# Patient Record
Sex: Female | Born: 1970 | Marital: Married | Smoking: Never smoker
Health system: Southern US, Community
[De-identification: ages and names within clinical notes are randomized; demographics above are authoritative.]

---

## 2020-03-13 ENCOUNTER — Encounter (HOSPITAL_COMMUNITY): Payer: Self-pay

## 2020-03-13 ENCOUNTER — Ambulatory Visit (HOSPITAL_COMMUNITY)
Admission: EM | Admit: 2020-03-13 | Discharge: 2020-03-13 | Disposition: A | Discharge status: Home / Self Care | Payer: Self-pay | Attending: Physician Assistant | Admitting: Physician Assistant

## 2020-03-13 ENCOUNTER — Other Ambulatory Visit: Payer: Self-pay

## 2020-03-13 DIAGNOSIS — M26621 Arthralgia of right temporomandibular joint: Secondary | ICD-10-CM

## 2020-03-13 MED ORDER — IBUPROFEN 800 MG PO TABS: 800.0000 mg | ORAL_TABLET | Freq: Three times a day (TID) | ORAL | 0 refills | Status: DC

## 2020-03-13 NOTE — ED Provider Notes (Signed)
[facility]    CSN: [id number] Arrival date & time: 03/13/20  1139      History   Chief Complaint Chief Complaint  Patient presents with   Jaw Pain    HPI Olivia Lee is a 49 y.o. female.   Patient here c/w R sided jaw pain, located at TMJ.  Worse with chewing, opening mouth wide.  Admits HA.  Denies f/c, swelling, erythema.  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     No past medical history on file.  There are no problems to display for this patient.     OB History   No obstetric history on file.      Home Medications    Prior to Admission medications   Medication Sig Start Date End Date Taking? Authorizing Provider  ibuprofen (ADVIL) 800 MG tablet Take 1 tablet (800 mg total) by mouth 3 (three) times daily. 03/13/20   Evern Core, PA-C    Family History Family History  Family history unknown: Yes    Social History Social History   Tobacco Use   Smoking status: Never Smoker     Allergies   Patient has no known allergies.   Review of Systems Review of Systems  Constitutional: Negative for chills, fatigue and fever.  HENT: Positive for dental problem. Negative for congestion, ear pain, facial swelling, nosebleeds, postnasal drip, rhinorrhea, sinus pressure, sinus pain and sore throat.   Eyes: Negative for pain and redness.  Respiratory: Negative for cough, shortness of breath and wheezing.   Gastrointestinal: Negative for abdominal pain, diarrhea, nausea and vomiting.  Musculoskeletal: Positive for myalgias. Negative for arthralgias.  Skin: Negative for rash.  Neurological: Positive for headaches. Negative for light-headedness.  Hematological: Negative for adenopathy. Does not bruise/bleed easily.  Psychiatric/Behavioral: Negative for confusion and sleep disturbance.     Physical Exam Triage Vital Signs ED Triage Vitals  Enc Vitals Group     BP 03/13/20 1322 109/74     Pulse  Rate 03/13/20 1322 85     Resp 03/13/20 1322 18     Temp 03/13/20 1322 98.3 F (36.8 C)     Temp Source 03/13/20 1322 Oral     SpO2 03/13/20 1322 100 %     Weight --      Height --      Head Circumference --      Peak Flow --      Pain Score 03/13/20 1324 7     Pain Loc --      Pain Edu? --      Excl. in GC? --    No data found.  Updated Vital Signs BP 109/74 (BP Location: Right Arm)    Pulse 85    Temp 98.3 F (36.8 C) (Oral)    Resp 18    SpO2 100%   Visual Acuity Right Eye Distance:   Left Eye Distance:   Bilateral Distance:    Right Eye Near:   Left Eye Near:    Bilateral Near:     Physical Exam Vitals and nursing note reviewed.  Constitutional:      General: She is not in acute distress.    Appearance: Normal appearance. She is not ill-appearing.  HENT:     Head: Normocephalic and atraumatic.     Right Ear: Tympanic membrane and ear canal normal.     Left Ear: Tympanic membrane and ear canal normal.     Nose: No  congestion or rhinorrhea.     Mouth/Throat:     Dentition: Abnormal dentition (multiple missing teeth). No dental tenderness, gingival swelling, dental caries or dental abscesses.     Pharynx: No oropharyngeal exudate or posterior oropharyngeal erythema.     Comments: B/L clicking/popping of TMJ Tenderness along R side masseter Eyes:     General: No scleral icterus.    Extraocular Movements: Extraocular movements intact.     Conjunctiva/sclera: Conjunctivae normal.  Cardiovascular:     Rate and Rhythm: Normal rate and regular rhythm.     Heart sounds: No murmur heard.   Pulmonary:     Effort: Pulmonary effort is normal. No respiratory distress.     Breath sounds: Normal breath sounds. No wheezing or rales.  Musculoskeletal:     Cervical back: Normal range of motion. No rigidity.  Skin:    Capillary Refill: Capillary refill takes less than 2 seconds.     Coloration: Skin is not jaundiced.     Findings: No rash.  Neurological:     General: No  focal deficit present.     Mental Status: She is alert and oriented to person, place, and time.     Motor: No weakness.     Gait: Gait normal.  Psychiatric:        Mood and Affect: Mood normal.        Behavior: Behavior normal.      UC Treatments / Results  Labs (all labs ordered are listed, but only abnormal results are displayed) Labs Reviewed - No data to display  EKG   Radiology No results found.  Procedures Procedures (including critical care time)  Medications Ordered in UC Medications - No data to display  Initial Impression / Assessment and Plan / UC Course  I have reviewed the triage vital signs and the nursing notes.  Pertinent labs & imaging results that were available during my care of the patient were reviewed by me and considered in my medical decision making (see chart for details).     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    ED Prescriptions    Medication Sig Dispense Auth. Provider   ibuprofen (ADVIL) 800 MG tablet Take 1 tablet (800 mg total) by mouth 3 (three) times daily. 21 tablet Evern Core, PA-C     PDMP not reviewed this encounter.   Evern Core, PA-C 03/13/20 1353

## 2020-03-13 NOTE — Discharge Instructions (Signed)
Follow up with dentist if no improvement.

## 2020-03-13 NOTE — ED Triage Notes (Signed)
Pt presents with right side jaw pain since last night.

## 2020-05-05 DIAGNOSIS — Z131 Encounter for screening for diabetes mellitus: Secondary | ICD-10-CM | POA: Diagnosis not present

## 2020-05-05 DIAGNOSIS — Z319 Encounter for procreative management, unspecified: Secondary | ICD-10-CM | POA: Diagnosis not present

## 2020-05-05 DIAGNOSIS — N926 Irregular menstruation, unspecified: Secondary | ICD-10-CM | POA: Diagnosis not present

## 2020-05-05 DIAGNOSIS — Z6820 Body mass index (BMI) 20.0-20.9, adult: Secondary | ICD-10-CM | POA: Diagnosis not present

## 2020-05-19 DIAGNOSIS — Z1231 Encounter for screening mammogram for malignant neoplasm of breast: Secondary | ICD-10-CM | POA: Diagnosis not present

## 2020-05-19 DIAGNOSIS — Z780 Asymptomatic menopausal state: Secondary | ICD-10-CM | POA: Diagnosis not present

## 2020-05-19 DIAGNOSIS — Z01411 Encounter for gynecological examination (general) (routine) with abnormal findings: Secondary | ICD-10-CM | POA: Diagnosis not present

## 2020-05-19 DIAGNOSIS — Z681 Body mass index (BMI) 19 or less, adult: Secondary | ICD-10-CM | POA: Diagnosis not present

## 2020-05-19 DIAGNOSIS — Z131 Encounter for screening for diabetes mellitus: Secondary | ICD-10-CM | POA: Diagnosis not present

## 2020-05-19 DIAGNOSIS — N926 Irregular menstruation, unspecified: Secondary | ICD-10-CM | POA: Diagnosis not present

## 2020-05-19 DIAGNOSIS — Z124 Encounter for screening for malignant neoplasm of cervix: Secondary | ICD-10-CM | POA: Diagnosis not present

## 2020-05-19 DIAGNOSIS — Z319 Encounter for procreative management, unspecified: Secondary | ICD-10-CM | POA: Diagnosis not present

## 2020-05-19 DIAGNOSIS — Z01419 Encounter for gynecological examination (general) (routine) without abnormal findings: Secondary | ICD-10-CM | POA: Diagnosis not present

## 2021-02-03 DIAGNOSIS — Z13220 Encounter for screening for lipoid disorders: Secondary | ICD-10-CM | POA: Diagnosis not present

## 2021-02-03 DIAGNOSIS — Z0000 Encounter for general adult medical examination without abnormal findings: Secondary | ICD-10-CM | POA: Diagnosis not present

## 2021-04-24 DIAGNOSIS — L299 Pruritus, unspecified: Secondary | ICD-10-CM | POA: Diagnosis not present

## 2021-04-24 DIAGNOSIS — M545 Low back pain, unspecified: Secondary | ICD-10-CM | POA: Diagnosis not present

## 2021-08-21 ENCOUNTER — Encounter (HOSPITAL_COMMUNITY): Payer: Self-pay

## 2021-08-21 ENCOUNTER — Emergency Department (HOSPITAL_COMMUNITY)
Admission: EM | Admit: 2021-08-21 | Discharge: 2021-08-22 | Discharge status: Against Medical Advice | Payer: BC Managed Care – PPO | Attending: Emergency Medicine | Admitting: Emergency Medicine

## 2021-08-21 DIAGNOSIS — N939 Abnormal uterine and vaginal bleeding, unspecified: Secondary | ICD-10-CM | POA: Insufficient documentation

## 2021-08-21 DIAGNOSIS — R1030 Lower abdominal pain, unspecified: Secondary | ICD-10-CM | POA: Insufficient documentation

## 2021-08-21 DIAGNOSIS — Z5321 Procedure and treatment not carried out due to patient leaving prior to being seen by health care provider: Secondary | ICD-10-CM | POA: Diagnosis not present

## 2021-08-21 DIAGNOSIS — D259 Leiomyoma of uterus, unspecified: Secondary | ICD-10-CM | POA: Diagnosis not present

## 2021-08-21 DIAGNOSIS — D252 Subserosal leiomyoma of uterus: Secondary | ICD-10-CM | POA: Diagnosis not present

## 2021-08-21 LAB — CBC
HCT: 39.8 % (ref 36.0–46.0)
Hemoglobin: 13.3 g/dL (ref 12.0–15.0)
MCH: 29.7 pg (ref 26.0–34.0)
MCHC: 33.4 g/dL (ref 30.0–36.0)
MCV: 88.8 fL (ref 80.0–100.0)
Platelets: 323 10*3/uL (ref 150–400)
RBC: 4.48 MIL/uL (ref 3.87–5.11)
RDW: 13.1 % (ref 11.5–15.5)
WBC: 6.1 10*3/uL (ref 4.0–10.5)
nRBC: 0 % (ref 0.0–0.2)

## 2021-08-21 LAB — COMPREHENSIVE METABOLIC PANEL
ALT: 25 U/L (ref 0–44)
AST: 21 U/L (ref 15–41)
Albumin: 3.7 g/dL (ref 3.5–5.0)
Alkaline Phosphatase: 64 U/L (ref 38–126)
Anion gap: 8 (ref 5–15)
BUN: 15 mg/dL (ref 6–20)
CO2: 21 mmol/L — ABNORMAL LOW (ref 22–32)
Calcium: 8.4 mg/dL — ABNORMAL LOW (ref 8.9–10.3)
Chloride: 103 mmol/L (ref 98–111)
Creatinine, Ser: 0.79 mg/dL (ref 0.44–1.00)
GFR, Estimated: >60 mL/min (ref >60)
Glucose, Bld: 104 mg/dL — ABNORMAL HIGH (ref 70–99)
Potassium: 3.6 mmol/L (ref 3.5–5.1)
Sodium: 132 mmol/L — ABNORMAL LOW (ref 135–145)
Total Bilirubin: 0.5 mg/dL (ref 0.3–1.2)
Total Protein: 6.9 g/dL (ref 6.5–8.1)

## 2021-08-21 LAB — I-STAT BETA HCG BLOOD, ED (MC, WL, AP ONLY): I-stat hCG, quantitative: <5 m[IU]/mL (ref <5)

## 2021-08-21 LAB — LIPASE, BLOOD: Lipase: 26 U/L (ref 11–51)

## 2021-08-21 NOTE — ED Provider Triage Note (Signed)
Emergency Medicine Provider Triage Evaluation Note  Elaya Mahjoub Rosezella [place] , a 51 y.o. female  was evaluated in triage.  Pt complains of heavy vaginal bleeding with severe lower abdominal cramping that started today.  Patient with no period for the last 2 months but prior to that was having bleeding every 3 weeks intermittently missing periods.  Patient is accompanied by her husband who serves as Equities trader.  They state that they are trying to conceive and show this provider over-the-counter fertility/conception supplements.  Review of Systems  Positive: Vaginal bleeding lower abdominal grade Negative: Thing fevers, chills, nausea, vomiting, diarrhea  Physical Exam  BP 121/83   Pulse 65   Temp 98.3 F (36.8 C) (Oral)   Resp 18   SpO2 100%  Gen:   Awake, no distress   Resp:  Normal effort  MSK:   Moves extremities without difficulty  Other:  Suprapubic tenderness palpation without rebound or guarding.  No CVAT.  Lungs CTA B.  Medical Decision Making  Medically screening exam initiated at 11:06 PM.  Appropriate orders placed.  Delia Mahjoub A Wienke was informed that the remainder of the evaluation will be completed by another provider, this initial triage assessment does not replace that evaluation, and the importance of remaining in the ED until their evaluation is complete.  This chart was dictated using voice recognition software, Dragon. Despite the best efforts of this provider to proofread and correct errors, errors may still occur which can change documentation meaning.    Paris Lore, PA-C 08/21/21 2315

## 2021-08-21 NOTE — ED Triage Notes (Signed)
Pt is here today bc of abd pain & vaginal bleeding . Pt reports that she is trying to get pregnant and is taking fertility pills to help and since she has been having vaginal bleeding

## 2021-08-22 ENCOUNTER — Emergency Department (HOSPITAL_COMMUNITY): Payer: BC Managed Care – PPO

## 2021-08-22 DIAGNOSIS — N939 Abnormal uterine and vaginal bleeding, unspecified: Secondary | ICD-10-CM | POA: Diagnosis not present

## 2021-08-22 DIAGNOSIS — D252 Subserosal leiomyoma of uterus: Secondary | ICD-10-CM | POA: Diagnosis not present

## 2021-08-22 DIAGNOSIS — D259 Leiomyoma of uterus, unspecified: Secondary | ICD-10-CM | POA: Diagnosis not present

## 2021-08-22 NOTE — ED Notes (Signed)
Spouse informed [facility] they were leaving

## 2022-02-06 DIAGNOSIS — Z1152 Encounter for screening for COVID-19: Secondary | ICD-10-CM | POA: Diagnosis not present

## 2022-02-06 DIAGNOSIS — J069 Acute upper respiratory infection, unspecified: Secondary | ICD-10-CM | POA: Diagnosis not present

## 2022-02-06 DIAGNOSIS — J029 Acute pharyngitis, unspecified: Secondary | ICD-10-CM | POA: Diagnosis not present

## 2022-02-06 DIAGNOSIS — R0982 Postnasal drip: Secondary | ICD-10-CM | POA: Diagnosis not present

## 2022-03-15 DIAGNOSIS — E559 Vitamin D deficiency, unspecified: Secondary | ICD-10-CM | POA: Diagnosis not present

## 2022-03-15 DIAGNOSIS — E7800 Pure hypercholesterolemia, unspecified: Secondary | ICD-10-CM | POA: Diagnosis not present

## 2022-03-15 DIAGNOSIS — E440 Moderate protein-calorie malnutrition: Secondary | ICD-10-CM | POA: Diagnosis not present

## 2022-03-15 DIAGNOSIS — Z0000 Encounter for general adult medical examination without abnormal findings: Secondary | ICD-10-CM | POA: Diagnosis not present

## 2022-03-15 DIAGNOSIS — R630 Anorexia: Secondary | ICD-10-CM | POA: Diagnosis not present

## 2022-06-25 DIAGNOSIS — R630 Anorexia: Secondary | ICD-10-CM | POA: Diagnosis not present

## 2022-06-25 DIAGNOSIS — Z6822 Body mass index (BMI) 22.0-22.9, adult: Secondary | ICD-10-CM | POA: Diagnosis not present

## 2022-10-03 DIAGNOSIS — R630 Anorexia: Secondary | ICD-10-CM | POA: Diagnosis not present

## 2022-10-03 DIAGNOSIS — Z1211 Encounter for screening for malignant neoplasm of colon: Secondary | ICD-10-CM | POA: Diagnosis not present

## 2022-11-01 DIAGNOSIS — M722 Plantar fascial fibromatosis: Secondary | ICD-10-CM | POA: Diagnosis not present

## 2022-11-27 DIAGNOSIS — M722 Plantar fascial fibromatosis: Secondary | ICD-10-CM | POA: Diagnosis not present

## 2022-11-27 DIAGNOSIS — Z6822 Body mass index (BMI) 22.0-22.9, adult: Secondary | ICD-10-CM | POA: Diagnosis not present

## 2022-12-14 ENCOUNTER — Ambulatory Visit (INDEPENDENT_AMBULATORY_CARE_PROVIDER_SITE_OTHER): Payer: BC Managed Care – PPO | Admitting: Podiatry

## 2022-12-14 ENCOUNTER — Encounter: Payer: Self-pay | Admitting: Podiatry

## 2022-12-14 DIAGNOSIS — M722 Plantar fascial fibromatosis: Secondary | ICD-10-CM | POA: Diagnosis not present

## 2022-12-14 NOTE — Progress Notes (Signed)
  Subjective:  Patient ID: Olivia Lee, female    DOB: 05-06-1970,  MRN: [id number]  Chief Complaint  Patient presents with   Plantar Fasciitis    Bilateral heel pain     52 y.o. female presents with the above complaint.  Patient presents with bilateral heel pain that has been going for quite some time is progressive gotten worse worse with ambulation worse with pressure she has not seen MRIs prior to seeing me denies any other acute complaint she stands a lot on her feet.  She wears 2 toe boots.   Review of Systems: Negative except as noted in the HPI. Denies N/V/F/Ch.  History reviewed. No pertinent past medical history.  Current Outpatient Medications:    amoxicillin (AMOXIL) 500 MG capsule, Take 500 mg by mouth 3 (three) times daily., Disp: , Rfl:    Azelastine HCl 137 MCG/SPRAY SOLN, Place into the nose., Disp: , Rfl:    megestrol (MEGACE) 40 MG tablet, Take 40 mg by mouth daily., Disp: , Rfl:    meloxicam (MOBIC) 15 MG tablet, Take by mouth., Disp: , Rfl:    mirtazapine (REMERON) 15 MG tablet, , Disp: , Rfl:    ibuprofen (ADVIL) 800 MG tablet, Take 1 tablet (800 mg total) by mouth 3 (three) times daily., Disp: 21 tablet, Rfl: 0  Social History   Tobacco Use  Smoking Status Never  Smokeless Tobacco Not on file    No Known Allergies Objective:  There were no vitals filed for this visit. There is no height or weight on file to calculate BMI. Constitutional Well developed. Well nourished.  Vascular Dorsalis pedis pulses palpable bilaterally. Posterior tibial pulses palpable bilaterally. Capillary refill normal to all digits.  No cyanosis or clubbing noted. Pedal hair growth normal.  Neurologic Normal speech. Oriented to person, place, and time. Epicritic sensation to light touch grossly present bilaterally.  Dermatologic Nails well groomed and normal in appearance. No open wounds. No skin lesions.  Orthopedic: Normal joint ROM without pain or crepitus  bilaterally. No visible deformities. Tender to palpation at the calcaneal tuber bilaterally. No pain with calcaneal squeeze bilaterally. Ankle ROM diminished range of motion bilaterally. Silfverskiold Test: positive bilaterally.   Radiographs: None  Assessment:   1. Plantar fasciitis of right foot   2. Plantar fasciitis of left foot    Plan:  Patient was evaluated and treated and all questions answered.  Plantar Fasciitis, bilaterally - XR reviewed as above.  - Educated on icing and stretching. Instructions given.  - Injection delivered to the plantar fascia as below. - DME: Plantar fascial brace dispensed to support the medial longitudinal arch of the foot and offload pressure from the heel and prevent arch collapse during weightbearing - Pharmacologic management: None  Procedure: Injection Tendon/Ligament Location: Bilateral plantar fascia at the glabrous junction; medial approach. Skin Prep: alcohol Injectate: 0.5 cc 0.5% marcaine plain, 0.5 cc of 1% Lidocaine, 0.5 cc kenalog 10. Disposition: Patient tolerated procedure well. Injection site dressed with a band-aid.  No follow-ups on file.

## 2023-01-11 ENCOUNTER — Encounter: Payer: Self-pay | Admitting: Podiatry

## 2023-01-11 ENCOUNTER — Ambulatory Visit (INDEPENDENT_AMBULATORY_CARE_PROVIDER_SITE_OTHER): Payer: BC Managed Care – PPO | Admitting: Podiatry

## 2023-01-11 DIAGNOSIS — M722 Plantar fascial fibromatosis: Secondary | ICD-10-CM | POA: Diagnosis not present

## 2023-01-11 DIAGNOSIS — Q667 Congenital pes cavus, unspecified foot: Secondary | ICD-10-CM

## 2023-01-11 NOTE — Progress Notes (Signed)
  Subjective:  Patient ID: Olivia Lee, female    DOB: 1970/10/24,  MRN: [id number]  Chief Complaint  Patient presents with   Plantar Fasciitis    RM# 7 Patient here for follow up on plantar fasciitis is wearing her braces states feels better but some pain.     52 y.o. female presents with the above complaint.  Patient presents for follow-up of bilateral plantar fasciitis.  She states injection is doing better.  The injection helped.  There is still some residual pain.  She denies any other acute complaints  Review of Systems: Negative except as noted in the HPI. Denies N/V/F/Ch.  History reviewed. No pertinent past medical history.  Current Outpatient Medications:    megestrol (MEGACE) 40 MG tablet, Take 40 mg by mouth daily., Disp: , Rfl:    naproxen (NAPROSYN) 500 MG tablet, Take 1 tablet (500 mg total) by mouth 2 (two) times daily as needed (pain)., Disp: 30 tablet, Rfl: 0  Social History   Tobacco Use  Smoking Status Never  Smokeless Tobacco Never    No Known Allergies Objective:  There were no vitals filed for this visit. There is no height or weight on file to calculate BMI. Constitutional Well developed. Well nourished.  Vascular Dorsalis pedis pulses palpable bilaterally. Posterior tibial pulses palpable bilaterally. Capillary refill normal to all digits.  No cyanosis or clubbing noted. Pedal hair growth normal.  Neurologic Normal speech. Oriented to person, place, and time. Epicritic sensation to light touch grossly present bilaterally.  Dermatologic Nails well groomed and normal in appearance. No open wounds. No skin lesions.  Orthopedic: Normal joint ROM without pain or crepitus bilaterally. No visible deformities. Tender to palpation at the calcaneal tuber bilaterally. No pain with calcaneal squeeze bilaterally. Ankle ROM diminished range of motion bilaterally. Silfverskiold Test: positive bilaterally.   Radiographs: None  Assessment:    No diagnosis found.  Plan:  Patient was evaluated and treated and all questions answered.  Plantar Fasciitis, bilaterally - XR reviewed as above.  - Educated on icing and stretching. Instructions given.  -Second Injection delivered to the plantar fascia as below. - DME: Plantar fascial brace dispensed to support the medial longitudinal arch of the foot and offload pressure from the heel and prevent arch collapse during weightbearing - Pharmacologic management: None  Pes cavus -I explained to patient the etiology of pes cavus and relationship with Planter fasciitis and various treatment options were discussed.  Given patient foot structure in the setting of Planter fasciitis I believe patient will benefit from custom-made orthotics to help control the hindfoot motion support the arch of the foot and take the stress away from plantar fascial.  Patient agrees with the plan like to proceed with orthotics -Patient was casted for orthotics   Procedure: Injection Tendon/Ligament Location: Bilateral plantar fascia at the glabrous junction; medial approach. Skin Prep: alcohol Injectate: 0.5 cc 0.5% marcaine plain, 0.5 cc of 1% Lidocaine, 0.5 cc kenalog 10. Disposition: Patient tolerated procedure well. Injection site dressed with a band-aid.  No follow-ups on file.

## 2023-01-16 ENCOUNTER — Other Ambulatory Visit: Payer: Self-pay

## 2023-01-16 ENCOUNTER — Encounter (HOSPITAL_COMMUNITY): Payer: Self-pay | Admitting: *Deleted

## 2023-01-16 ENCOUNTER — Ambulatory Visit (HOSPITAL_COMMUNITY)
Admission: EM | Admit: 2023-01-16 | Discharge: 2023-01-16 | Disposition: A | Discharge status: Home / Self Care | Payer: BC Managed Care – PPO | Attending: Sports Medicine | Admitting: Sports Medicine

## 2023-01-16 DIAGNOSIS — M65839 Other synovitis and tenosynovitis, unspecified forearm: Secondary | ICD-10-CM | POA: Diagnosis not present

## 2023-01-16 MED ORDER — MELOXICAM 7.5 MG PO TABS: 15.0000 mg | ORAL_TABLET | Freq: Every day | ORAL | 0 refills | Status: DC

## 2023-01-16 NOTE — ED Provider Notes (Signed)
[facility]    CSN: [id number] Arrival date & time: 01/16/23  0907      History   Chief Complaint Chief Complaint  Patient presents with   Wrist Pain    HPI Olivia Lee is a 52 y.o. female.   Olivia Lee is a 52yo female here for evaluation of right wrist pain. Pain onset was two days ago.  She denies an injury that prompted her pain though states that she does a lot of repetitive wrist radial and ulnar deviation as well as flexion and extension with her work.  She works on an Theatre stage manager.  She thinks this is an overuse injury.  She locates pain on the dorsal aspect of her right forearm and notes some associated swelling.  She has taken some Tylenol and ibuprofen with mild improvement but the pain is continuing to limit her functionality at work.    The history is provided by the patient and the spouse. The history is limited by a language barrier. No language interpreter was used (Patient and husband elected for husband to translate.).  Wrist Pain    History reviewed. No pertinent past medical history.  There are no problems to display for this patient.   History reviewed. No pertinent surgical history.  OB History   No obstetric history on file.      Home Medications    Prior to Admission medications   Medication Sig Start Date End Date Taking? Authorizing Provider  meloxicam (MOBIC) 7.5 MG tablet Take 2 tablets (15 mg total) by mouth daily. 01/16/23  Yes Marisa Cyphers, MD  amoxicillin (AMOXIL) 500 MG capsule Take 500 mg by mouth 3 (three) times daily. Patient not taking: Reported on 01/11/2023 07/12/22   [provider]  Azelastine HCl 137 MCG/SPRAY SOLN Place into the nose. Patient not taking: Reported on 01/11/2023 02/06/22   [provider]  megestrol (MEGACE) 40 MG tablet Take 40 mg by mouth daily. Patient not taking: Reported on 01/11/2023 07/02/22   [provider]  mirtazapine (REMERON) 15 MG tablet  10/30/22    [provider]    Family History Family History  Family history unknown: Yes    Social History Social History   Tobacco Use   Smoking status: Never     Allergies   Patient has no known allergies.   Review of Systems Review of Systems  Musculoskeletal:  Positive for arthralgias and joint swelling.     Physical Exam Triage Vital Signs ED Triage Vitals  Encounter Vitals Group     BP 01/16/23 0950 111/73     Systolic BP Percentile --      Diastolic BP Percentile --      Pulse Rate 01/16/23 0950 81     Resp 01/16/23 0950 18     Temp 01/16/23 0950 97.7 F (36.5 C)     Temp src --      SpO2 01/16/23 0950 99 %     Weight --      Height --      Head Circumference --      Peak Flow --      Pain Score 01/16/23 0948 8     Pain Loc --      Pain Education --      Exclude from Growth Chart --    No data found.  Updated Vital Signs BP 111/73   Pulse 81   Temp 97.7 F (36.5 C)   Resp 18  SpO2 99%   Visual Acuity Right Eye Distance:   Left Eye Distance:   Bilateral Distance:    Right Eye Near:   Left Eye Near:    Bilateral Near:     Physical Exam Constitutional:      General: She is not in acute distress.    Appearance: Normal appearance. She is normal weight. She is not ill-appearing.  Cardiovascular:     Rate and Rhythm: Normal rate and regular rhythm.     Pulses: Normal pulses.  Pulmonary:     Effort: Pulmonary effort is normal.     Breath sounds: Normal breath sounds.  Musculoskeletal:     Comments: Right wrist with no appreciable deformity.  She does have visible swelling on the dorsal aspect of the right forearm roughly 5 cm proximal to the distal radius. Reproduction of pain with palpation over this area of swelling. She has no pain with wrist flexion extension radial or ulnar deviation. Range of motion is full. Strength with thumb flexion, extension, and abduction is normal. Negative Finkelsteins  Neurological:     Mental Status:  She is alert.      UC Treatments / Results  Labs (all labs ordered are listed, but only abnormal results are displayed) Labs Reviewed - No data to display  EKG   Radiology No results found.  Procedures Procedures (including critical care time)  Medications Ordered in UC Medications - No data to display  Initial Impression / Assessment and Plan / UC Course  I have reviewed the triage vital signs and the nursing notes.  Pertinent labs & imaging results that were available during my care of the patient were reviewed by me and considered in my medical decision making (see chart for details).    Vitals and triage reviewed, patient is hemodynamically stable.  History and physical exam most consistent with a diagnosis of intersection syndrome of the right wrist.  Less likely de Quervain's given location is more proximal than is typical and de Quervain's.  We discussed that this is an overuse injury and I would like for her to have some restriction in her thumb and wrist range of motion for the next few weeks.  She was provided a thumb spica splint today.  I also recommended that she try a course of meloxicam 15 mg for 2 weeks, icing 1-2 times daily, and Tylenol as needed.  We discussed the possibility of a cortisone injection though she would like to try more conservative measures first.  We will have her follow-up in the sports medicine office in 2 to 3 weeks for consideration of a cortisone injection under ultrasound guidance if she is failing to make improvements.  Patient and her husband's questions were answered and they are in agreement with this plan.  I did provide him a work note to excuse her for work for the rest of this week as well as making accommodations for her to work with her wrist brace next week.  Final Clinical Impressions(s) / UC Diagnoses   Final diagnoses:  Intersection syndrome of wrist     Discharge Instructions      You have intersection syndrome of your  right wrist. This is an overuse injury. I would like for you to take Meloxicam (prescription sent) two tablets once daily for two weeks. Avoid other anti-inflammatories while taking this (such as ibuprofen or naproxen). You may also take tylenol 500-1000mg  every 8 hours and apply ice to the wrist. Please keep your wrist in the  brace provided.   If your pain is still  ongoing in two weeks, please call the sports medicine office to schedule a visit for an ultrasound guided injection into the wrist. [facility] Address: [address] [place], [place], [place] [address] Phone: [phone]   ED Prescriptions     Medication Sig Dispense Auth. Provider   meloxicam (MOBIC) 7.5 MG tablet Take 2 tablets (15 mg total) by mouth daily. 28 tablet Marisa Cyphers, MD      PDMP not reviewed this encounter.   Marisa Cyphers, MD 01/16/23 [phone]

## 2023-01-16 NOTE — Discharge Instructions (Addendum)
You have intersection syndrome of your right wrist. This is an overuse injury. I would like for you to take Meloxicam (prescription sent) two tablets once daily for two weeks. Avoid other anti-inflammatories while taking this (such as ibuprofen or naproxen). You may also take tylenol 500-1000mg  every 8 hours and apply ice to the wrist. Please keep your wrist in the brace provided.   If your pain is still  ongoing in two weeks, please call the sports medicine office to schedule a visit for an ultrasound guided injection into the wrist. [facility] Address: [address] [place], [place], [place] [address] Phone: [phone]

## 2023-01-16 NOTE — ED Triage Notes (Signed)
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

## 2023-01-26 ENCOUNTER — Ambulatory Visit (HOSPITAL_COMMUNITY)
Admission: EM | Admit: 2023-01-26 | Discharge: 2023-01-26 | Disposition: A | Discharge status: Home / Self Care | Payer: BC Managed Care – PPO | Attending: Family Medicine | Admitting: Family Medicine

## 2023-01-26 ENCOUNTER — Encounter (HOSPITAL_COMMUNITY): Payer: Self-pay

## 2023-01-26 ENCOUNTER — Ambulatory Visit (INDEPENDENT_AMBULATORY_CARE_PROVIDER_SITE_OTHER): Payer: BC Managed Care – PPO

## 2023-01-26 DIAGNOSIS — M79601 Pain in right arm: Secondary | ICD-10-CM | POA: Diagnosis not present

## 2023-01-26 DIAGNOSIS — M79631 Pain in right forearm: Secondary | ICD-10-CM | POA: Diagnosis not present

## 2023-01-26 DIAGNOSIS — M7989 Other specified soft tissue disorders: Secondary | ICD-10-CM | POA: Diagnosis not present

## 2023-01-26 MED ORDER — NAPROXEN 500 MG PO TABS: 500.0000 mg | ORAL_TABLET | Freq: Two times a day (BID) | ORAL | 0 refills | Status: DC | PRN

## 2023-01-26 NOTE — Discharge Instructions (Signed)
Your x-ray does not show any abnormality of the bones.  This is most likely inflammation and soft tissues causing your pain.  The radiologist will also read your x-ray, and if their interpretation differs significantly from mine, we will call you.  Stop taking meloxicam and take instead naproxen 500 mg--1 tablet every 12 hours as needed for pain  Follow-up with orthopedics/sports medicine.

## 2023-01-26 NOTE — ED Provider Notes (Signed)
[facility]    CSN: [id number] Arrival date & time: 01/26/23  1103      History   Chief Complaint Chief Complaint  Patient presents with   Wrist Pain    HPI Olivia Lee is a 52 y.o. female.    Wrist Pain  Here for pain in her right distal forearm.  She was seen about 2 weeks ago here and provided a wrist brace and some Mobic for probable intersection syndrome.  She states the medicine did help but she has developed some swelling over the painful area and it is still hurting her, especially when she is working.    History reviewed. No pertinent past medical history.  There are no problems to display for this patient.   History reviewed. No pertinent surgical history.  OB History   No obstetric history on file.      Home Medications    Prior to Admission medications   Medication Sig Start Date End Date Taking? Authorizing Provider  megestrol (MEGACE) 40 MG tablet Take 40 mg by mouth daily. 07/02/22  Yes [provider]  naproxen (NAPROSYN) 500 MG tablet Take 1 tablet (500 mg total) by mouth 2 (two) times daily as needed (pain). 01/26/23  Yes Rafeef Lau, Janace Aris, MD    Family History Family History  Family history unknown: Yes    Social History Social History   Tobacco Use   Smoking status: Never   Smokeless tobacco: Never  Vaping Use   Vaping status: Never Used  Substance Use Topics   Alcohol use: Never   Drug use: Never     Allergies   Patient has no known allergies.   Review of Systems Review of Systems   Physical Exam Triage Vital Signs ED Triage Vitals  Encounter Vitals Group     BP 01/26/23 1202 106/66     Systolic BP Percentile --      Diastolic BP Percentile --      Pulse Rate 01/26/23 1202 69     Resp 01/26/23 1202 16     Temp 01/26/23 1202 98.2 F (36.8 C)     Temp Source 01/26/23 1202 Oral     SpO2 01/26/23 1202 99 %     Weight --      Height --      Head Circumference --      Peak Flow --       Pain Score 01/26/23 1200 8     Pain Loc --      Pain Education --      Exclude from Growth Chart --    No data found.  Updated Vital Signs BP 106/66 (BP Location: Left Arm)   Pulse 69   Temp 98.2 F (36.8 C) (Oral)   Resp 16   SpO2 99%   Visual Acuity Right Eye Distance:   Left Eye Distance:   Bilateral Distance:    Right Eye Near:   Left Eye Near:    Bilateral Near:     Physical Exam Vitals reviewed.  Constitutional:      General: She is not in acute distress.    Appearance: She is not toxic-appearing.  Musculoskeletal:     Comments: There is a swollen area about 2.5 cm in diameter on the dorsal surface of the distal forearm on the right.  There is no erythema, it is possibly a little bluish in discoloration. It is tender there.   Skin:    Coloration: Skin is not  pale.  Neurological:     Mental Status: She is alert.      UC Treatments / Results  Labs (all labs ordered are listed, but only abnormal results are displayed) Labs Reviewed - No data to display  EKG   Radiology DG Forearm Right  Result Date: 01/26/2023 CLINICAL DATA:  52 year old female with pain and swelling. EXAM: RIGHT FOREARM - 2 VIEW COMPARISON:  None Available. FINDINGS: Bone mineralization is within normal limits. Maintained alignment at the wrist and elbow. No evidence of elbow joint effusion. No osseous abnormality identified. Possible dorsal soft tissue swelling and stranding at the mid forearm. No soft tissue gas. No radiopaque foreign body identified. IMPRESSION: Possible soft tissue swelling at the dorsal forearm. No osseous abnormality identified. Electronically Signed   By: Odessa Fleming M.D.   On: 01/26/2023 13:15    Procedures Procedures (including critical care time)  Medications Ordered in UC Medications - No data to display  Initial Impression / Assessment and Plan / UC Course  I have reviewed the triage vital signs and the nursing notes.  Pertinent labs & imaging results  that were available during my care of the patient were reviewed by me and considered in my medical decision making (see chart for details).        x-ray by my review is negative for any bony pathology.  Meloxicam is sent in for further pain relief, and she is given contact information for orthopedics/sports medicine to be evaluated further.  Have done a work note also for her. Final Clinical Impressions(s) / UC Diagnoses   Final diagnoses:  Right forearm pain     Discharge Instructions      Your x-ray does not show any abnormality of the bones.  This is most likely inflammation and soft tissues causing your pain.  The radiologist will also read your x-ray, and if their interpretation differs significantly from mine, we will call you.  Stop taking meloxicam and take instead naproxen 500 mg--1 tablet every 12 hours as needed for pain  Follow-up with orthopedics/sports medicine.       ED Prescriptions     Medication Sig Dispense Auth. Provider   naproxen (NAPROSYN) 500 MG tablet Take 1 tablet (500 mg total) by mouth 2 (two) times daily as needed (pain). 30 tablet Timmothy Baranowski, Janace Aris, MD      PDMP not reviewed this encounter.   Zenia Resides, MD 01/26/23 [phone]

## 2023-01-26 NOTE — ED Triage Notes (Addendum)
Right wrist pain onset 2 weeks. Was seen for this  while back but still having pain. Patient work in a [employer]. No known falls or injuries.

## 2023-02-16 ENCOUNTER — Ambulatory Visit (HOSPITAL_COMMUNITY)
Admission: EM | Admit: 2023-02-16 | Discharge: 2023-02-16 | Disposition: A | Discharge status: Home / Self Care | Payer: BC Managed Care – PPO | Attending: Family Medicine | Admitting: Family Medicine

## 2023-02-16 ENCOUNTER — Encounter (HOSPITAL_COMMUNITY): Payer: Self-pay | Admitting: Emergency Medicine

## 2023-02-16 DIAGNOSIS — M25531 Pain in right wrist: Secondary | ICD-10-CM | POA: Diagnosis not present

## 2023-02-16 NOTE — ED Triage Notes (Signed)
Pt pre sent for follow up on right wrist pain. She was seen 01/26/23 and given a brace. States she still has some pain and swelling.   She needs note for clearance to go back to full duty at her job

## 2023-02-16 NOTE — Discharge Instructions (Signed)
If not allergic, you may use over the counter ibuprofen or acetaminophen as needed. ° °

## 2023-02-18 NOTE — ED Provider Notes (Signed)
  [facility]   [id number] 02/16/23 Arrival Time: 1139  ASSESSMENT & PLAN:  1. Right wrist pain    Wrist is feeling better. Will return to work without wrist brace and with light duty. If she does ok, she may return to full duties after that. Note expressing this given to her.  OTC analgesics as needed. Recommend:  Follow-up Information     [facility].   Why: If worsening or failing to improve as anticipated. Contact information: [address] [place] [address] [phone]               Reviewed expectations re: course of current medical issues. Questions answered. Outlined signs and symptoms indicating need for more acute intervention. Patient verbalized understanding. After Visit Summary given.  SUBJECTIVE: History from: patient. Olivia Lee is a 52 y.o. female who is here for f/u; R wrist; feeling better. Needs work note.   OBJECTIVE:  Vitals:   02/16/23 1245  BP: 107/71  Pulse: 74  Resp: 16  Temp: 98.4 F (36.9 C)  TempSrc: Oral  SpO2: 98%    General appearance: alert; no distress Extremities: RUE: warm with well perfused appearance; without TTP over distal radial wrist; wrist with FROM Psychological: alert and cooperative; normal mood and affect    No Known Allergies  History reviewed. No pertinent past medical history. Social History   Socioeconomic History   Marital status: Married    Spouse name: Not on file   Number of children: Not on file   Years of education: Not on file   Highest education level: Not on file  Occupational History   Not on file  Tobacco Use   Smoking status: Never   Smokeless tobacco: Never  Vaping Use   Vaping status: Never Used  Substance and Sexual Activity   Alcohol use: Never   Drug use: Never   Sexual activity: Yes  Other Topics Concern   Not on file  Social History Narrative   Not on file   Social Determinants of Health    Financial Resource Strain: Not on file  Food Insecurity: Not on file  Transportation Needs: Not on file  Physical Activity: Not on file  Stress: Not on file  Social Connections: Not on file   Family History  Family history unknown: Yes   History reviewed. No pertinent surgical history.     Mardella Layman, MD 02/18/23 [phone]

## 2023-03-03 ENCOUNTER — Encounter (HOSPITAL_COMMUNITY): Payer: Self-pay | Admitting: Emergency Medicine

## 2023-03-03 ENCOUNTER — Ambulatory Visit (HOSPITAL_COMMUNITY)
Admission: EM | Admit: 2023-03-03 | Discharge: 2023-03-03 | Disposition: A | Discharge status: Home / Self Care | Payer: BC Managed Care – PPO | Attending: Emergency Medicine | Admitting: Emergency Medicine

## 2023-03-03 ENCOUNTER — Other Ambulatory Visit: Payer: Self-pay

## 2023-03-03 DIAGNOSIS — M25531 Pain in right wrist: Secondary | ICD-10-CM

## 2023-03-03 MED ORDER — NAPROXEN 500 MG PO TABS: 500.0000 mg | ORAL_TABLET | Freq: Two times a day (BID) | ORAL | 0 refills | Status: AC | PRN

## 2023-03-03 NOTE — ED Provider Notes (Signed)
[facility]    CSN: [id number] Arrival date & time: 03/03/23  1024      History   Chief Complaint Chief Complaint  Patient presents with   Wrist Pain    HPI Olivia Lee is a 52 y.o. female.   Patient presents to clinic with her spouse.  Declined language interpreter, husband to interpret.  Right wrist pain continues for the past few months. At last visit she had a few days off of work and was placed on light duty, this helped her. She went back to work and her wrist pain resumed. No recent falls or trauma. Has stopped wearing her backs. Was taking the Naproxen.   Has not followed up with orthopedics.   The history is provided by the patient, the spouse and medical records. The history is limited by a language barrier. No language interpreter was used.  Wrist Pain    History reviewed. No pertinent past medical history.  There are no active problems to display for this patient.   History reviewed. No pertinent surgical history.  OB History   No obstetric history on file.      Home Medications    Prior to Admission medications   Medication Sig Start Date End Date Taking? Authorizing Provider  megestrol (MEGACE) 40 MG tablet Take 40 mg by mouth daily. 07/02/22   [provider]  naproxen (NAPROSYN) 500 MG tablet Take 1 tablet (500 mg total) by mouth 2 (two) times daily as needed (pain). 03/03/23   Anai Lipson, [place] N, FNP    Family History Family History  Family history unknown: Yes    Social History Social History   Tobacco Use   Smoking status: Never   Smokeless tobacco: Never  Vaping Use   Vaping status: Never Used  Substance Use Topics   Alcohol use: Never   Drug use: Never     Allergies   Patient has no known allergies.   Review of Systems Review of Systems  Per HPI   Physical Exam Triage Vital Signs ED Triage Vitals [03/03/23 1054]  Encounter Vitals Group     BP 123/83     Systolic BP Percentile       Diastolic BP Percentile      Pulse Rate 65     Resp 16     Temp 98 F (36.7 C)     Temp Source Oral     SpO2 99 %     Weight      Height      Head Circumference      Peak Flow      Pain Score 8     Pain Loc      Pain Education      Exclude from Growth Chart    No data found.  Updated Vital Signs BP 123/83 (BP Location: Right Arm)   Pulse 65   Temp 98 F (36.7 C) (Oral)   Resp 16   SpO2 99%   Visual Acuity Right Eye Distance:   Left Eye Distance:   Bilateral Distance:    Right Eye Near:   Left Eye Near:    Bilateral Near:     Physical Exam Vitals and nursing note reviewed.  Constitutional:      Appearance: Normal appearance.  HENT:     Head: Normocephalic and atraumatic.     Right Ear: External ear normal.     Left Ear: External ear normal.     Nose: Nose normal.  Mouth/Throat:     Mouth: Mucous membranes are moist.  Eyes:     Conjunctiva/sclera: Conjunctivae normal.  Cardiovascular:     Rate and Rhythm: Normal rate.     Pulses: Normal pulses.  Pulmonary:     Effort: Pulmonary effort is normal. No respiratory distress.  Musculoskeletal:        General: Normal range of motion.  Skin:    General: Skin is warm and dry.  Neurological:     General: No focal deficit present.     Mental Status: She is alert and oriented to person, place, and time.  Psychiatric:        Mood and Affect: Mood normal.        Behavior: Behavior normal.      UC Treatments / Results  Labs (all labs ordered are listed, but only abnormal results are displayed) Labs Reviewed - No data to display  EKG   Radiology No results found.  Procedures Procedures (including critical care time)  Medications Ordered in UC Medications - No data to display  Initial Impression / Assessment and Plan / UC Course  I have reviewed the triage vital signs and the nursing notes.  Pertinent labs & imaging results that were available during my care of the patient were reviewed by me  and considered in my medical decision making (see chart for details).  Vitals and triage reviewed, patient is hemodynamically stable. Wrist pain continues, atraumatic. Negative forearm and wrist imaging over the past few months. NSAIDs and time off work tend to help.  Imaging deferred at this time, no new falls or trauma.  Wrist tenderness and distal radial tenderness to palpation.  Full range of motion intact, able to make a fist, radial pulses 2+ and brisk capillary refill.  Has her wrist brace at home.  Will refill NSAIDs.  Encouraged orthopedic follow-up for further evaluation.  Plan of care, follow-up care and return precautions given, no questions at this time.     Final Clinical Impressions(s) / UC Diagnoses   Final diagnoses:  Right wrist pain     Discharge Instructions      Since she has continued to have wrist pain for the past 2 months, please follow-up with an orthopedic for further evaluation.  You can continue to take the anti-inflammatories twice daily as needed with food. On her previous imaging she had no fractures or bony abnormalities.   It is important that you follow-up with an orthopedic for her continued wrist pain, as they have advanced imaging and other treatment options available.    ED Prescriptions     Medication Sig Dispense Auth. Provider   naproxen (NAPROSYN) 500 MG tablet Take 1 tablet (500 mg total) by mouth 2 (two) times daily as needed (pain). 30 tablet Dianca Owensby, [place] N, [place]      PDMP not reviewed this encounter.   Liann Spaeth, [place] N, [place] 03/03/23 1139

## 2023-03-03 NOTE — Discharge Instructions (Addendum)
Since she has continued to have wrist pain for the past 2 months, please follow-up with an orthopedic for further evaluation.  You can continue to take the anti-inflammatories twice daily as needed with food. On her previous imaging she had no fractures or bony abnormalities.   It is important that you follow-up with an orthopedic for her continued wrist pain, as they have advanced imaging and other treatment options available.

## 2023-03-03 NOTE — ED Triage Notes (Signed)
Pt arrive c/o right wrist pain and swollen for the past month.

## 2023-03-03 NOTE — ED Notes (Signed)
Follow up information on discharge instructions was highlighted by provider.  Stressed the importance of following up with this specialist group for further care of wrist pain.

## 2023-03-04 IMAGING — US US TRANSVAGINAL NON-OB
1 series · 13 of 25 positions shown · non-contrast
Comparison: None Available.

CLINICAL DATA: Vaginal bleeding

EXAM:
TRANSABDOMINAL AND TRANSVAGINAL ULTRASOUND OF PELVIS
DOPPLER ULTRASOUND OF OVARIES
TECHNIQUE: Both transabdominal and transvaginal ultrasound examinations of the
pelvis were performed. Transabdominal technique was performed for
global imaging of the pelvis including uterus, ovaries, adnexal
regions, and pelvic cul-de-sac.
It was necessary to proceed with endovaginal exam following the
transabdominal exam to visualize the uterus endometrium ovaries.
Color and duplex Doppler ultrasound was utilized to evaluate blood
flow to the ovaries.

[Series 1: us pelvis transvaginal non-ob (tv only) · 13 of 130 slices shown]
[im 1/130]
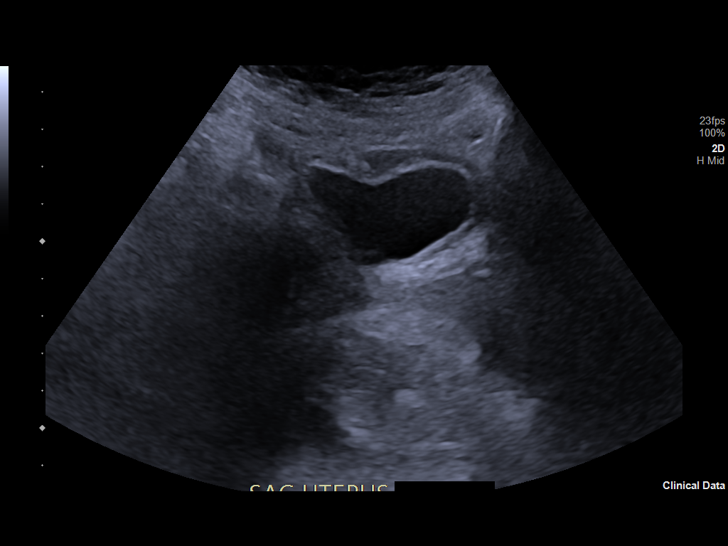
[im 11/130]
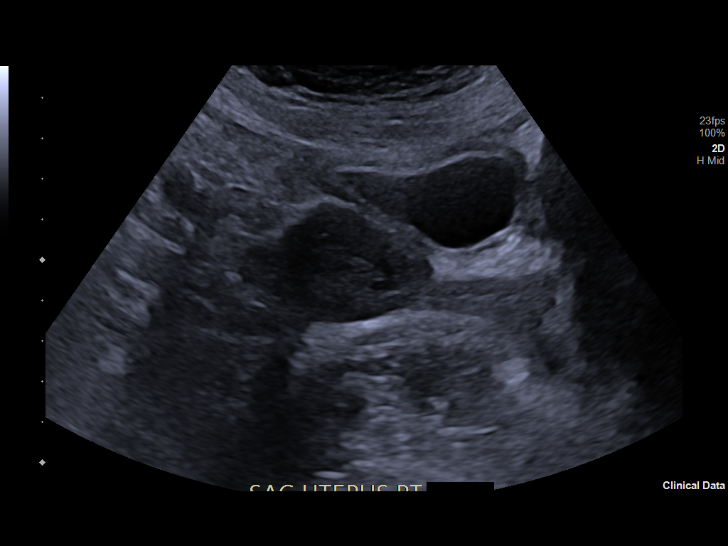
[im 22/130]
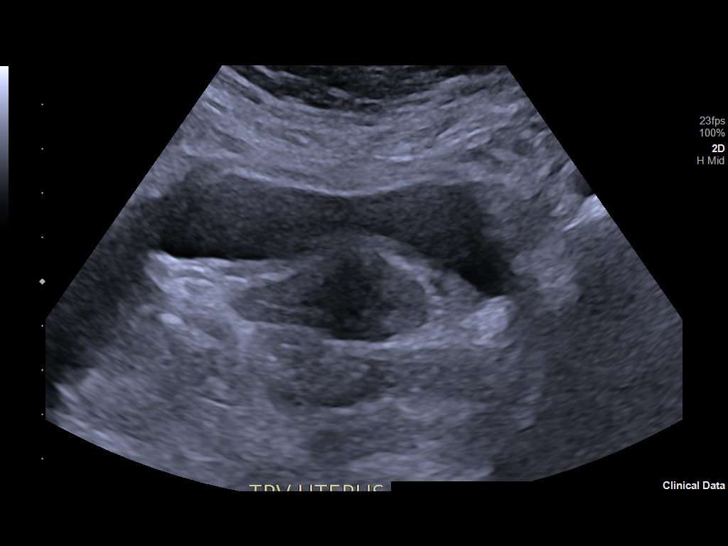
[im 33/130]
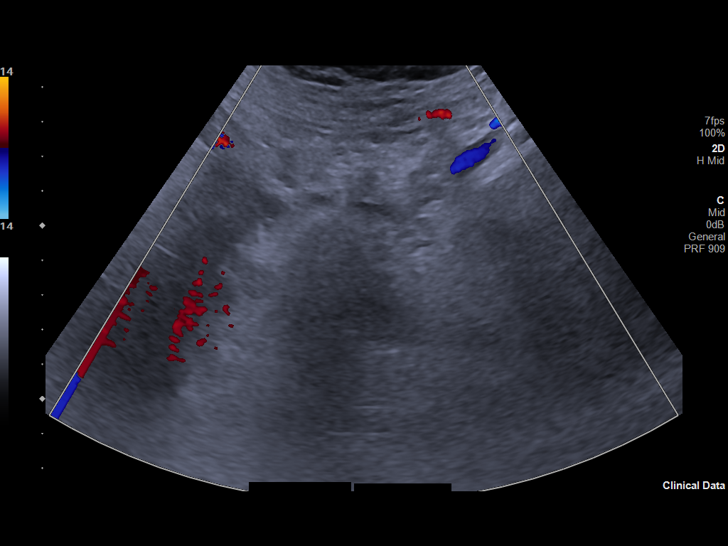
[im 44/130]
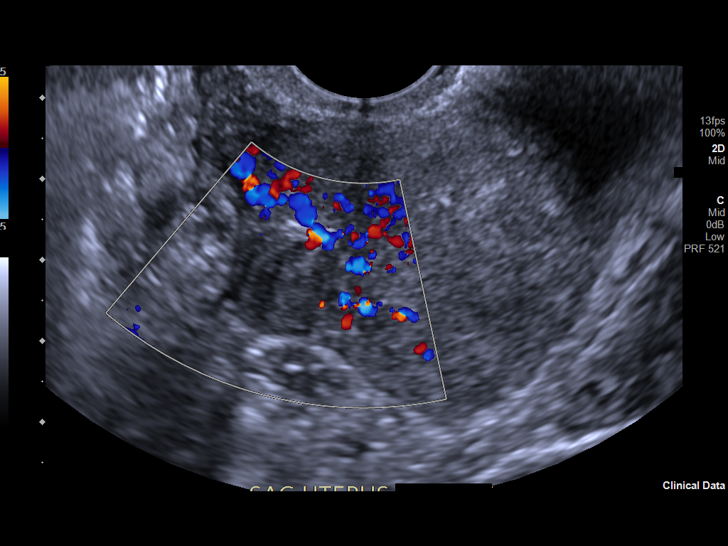
[im 54/130]
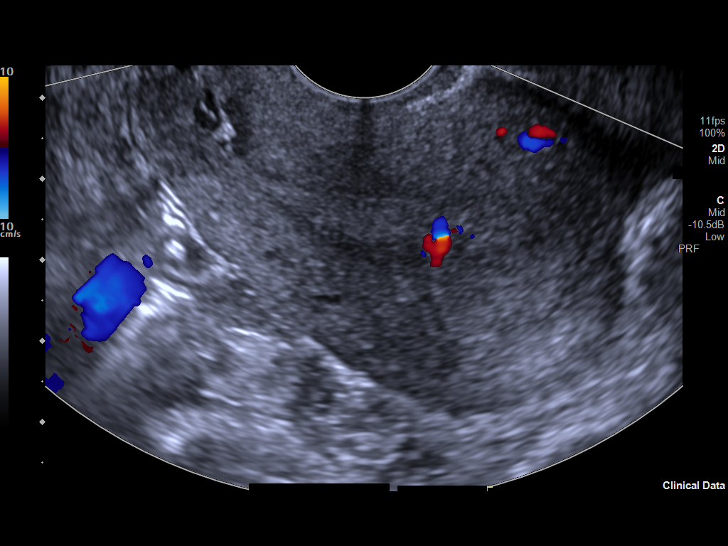
[im 65/130]
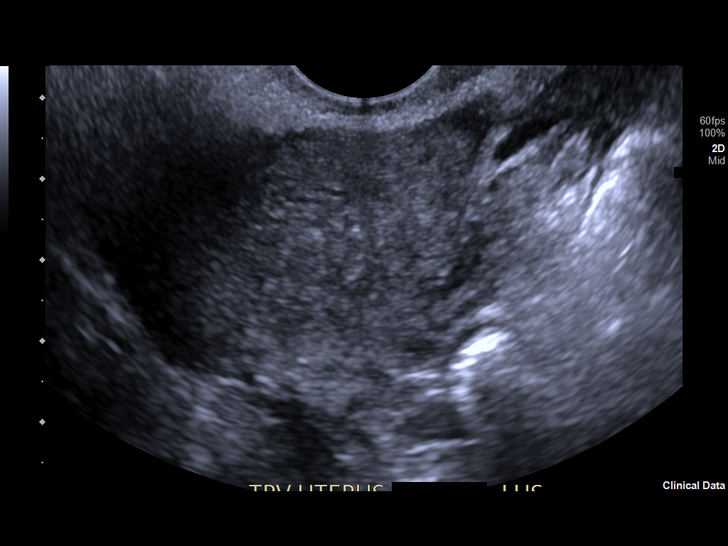
[im 76/130]
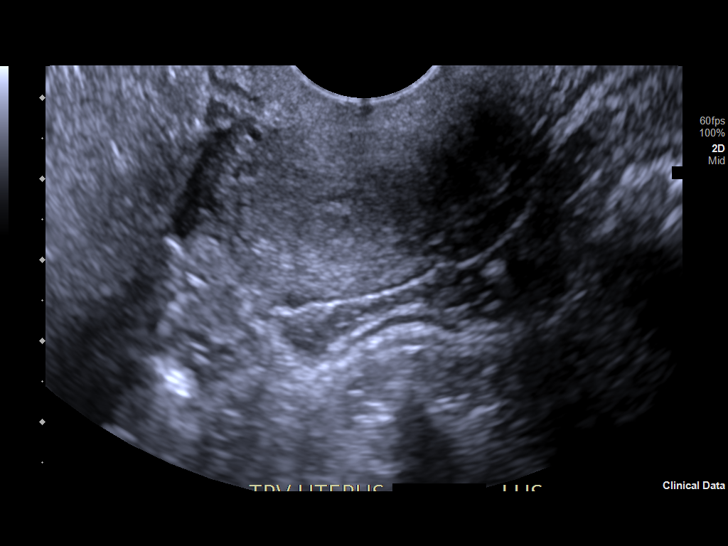
[im 87/130]
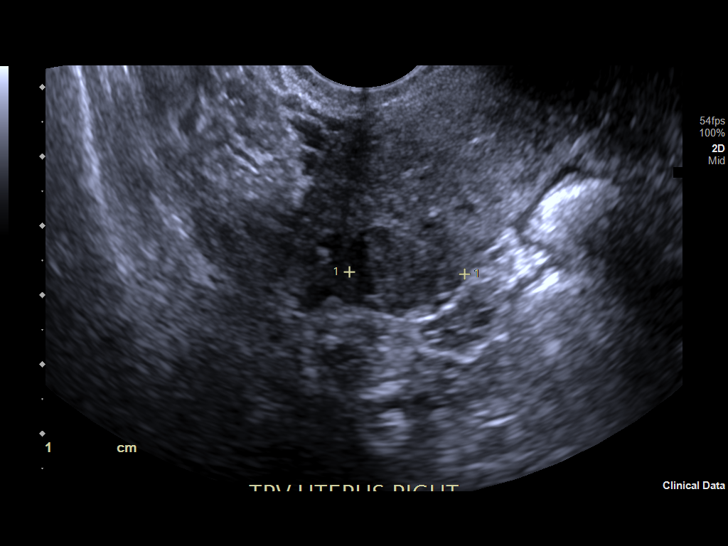
[im 97/130]
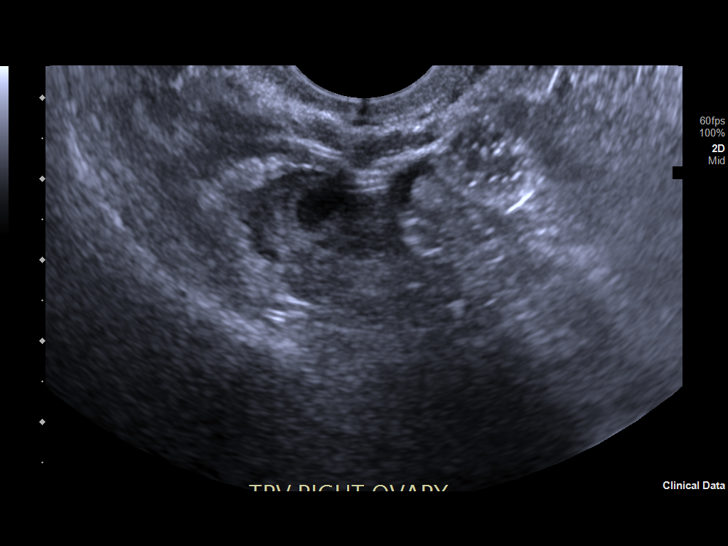
[im 108/130]
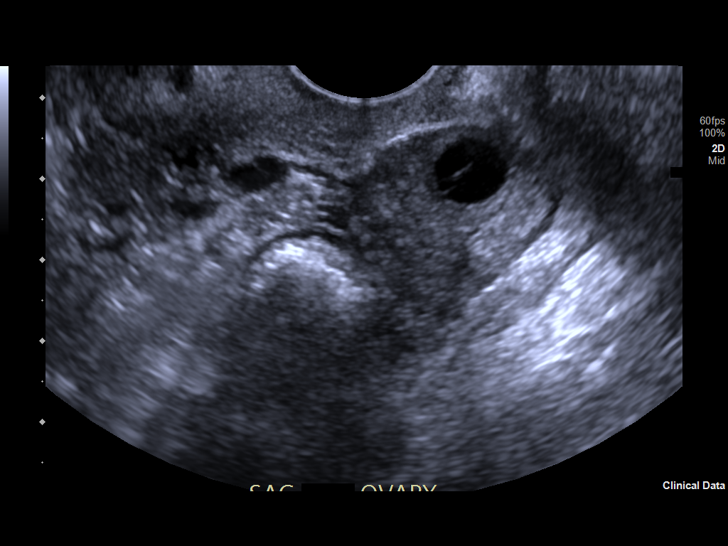
[im 119/130]
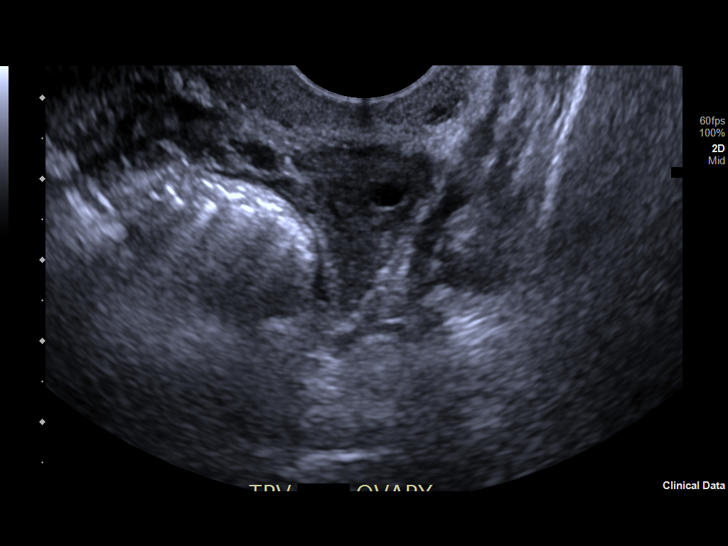
[im 130/130]
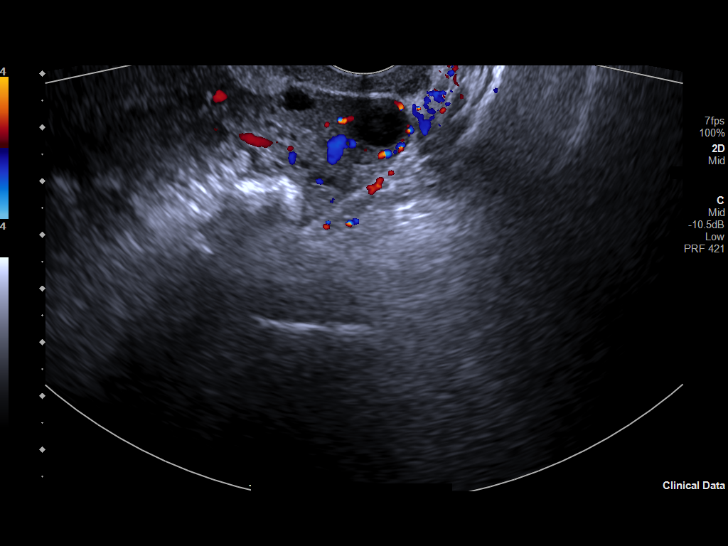

[13 of 25 positions shown; findings below may reference images not displayed]

FINDINGS: Uterus

Measurements: 6.7 x 4 x 4.5 cm = volume: 63 mL. Small uterine
fibroids. Anterior intramural mass measuring 7 by 7 x 6 mm. Anterior
lower uterine segment subserosal fibroid measuring 15 x 12 x 17 mm.

Endometrium

Thickness: 6.4 mm.  No focal abnormality visualized.

Right ovary

Measurements: 3.2 x 1.7 x 2.3 cm = volume: 6.3 mL. Normal
appearance/no adnexal mass.

Left ovary

Measurements: 2.8 x 2.1 x 2 5 cm = volume: 7.7 mL. Normal
appearance/no adnexal mass.

Pulsed Doppler evaluation of both ovaries demonstrates normal
low-resistance arterial and venous waveforms.

Other findings

Trace free fluid
IMPRESSION: 1. Normal premenopausal endometrial thickness.
2. Small uterine fibroids
3. Negative for ovarian torsion

## 2023-03-05 ENCOUNTER — Other Ambulatory Visit: Payer: Self-pay

## 2023-03-05 ENCOUNTER — Encounter: Payer: Self-pay | Admitting: Family Medicine

## 2023-03-05 ENCOUNTER — Ambulatory Visit (INDEPENDENT_AMBULATORY_CARE_PROVIDER_SITE_OTHER): Payer: BC Managed Care – PPO | Admitting: Family Medicine

## 2023-03-05 VITALS — BP 113/74 | Ht 62.99 in | Wt 150.0 lb

## 2023-03-05 DIAGNOSIS — M25531 Pain in right wrist: Secondary | ICD-10-CM

## 2023-03-05 DIAGNOSIS — M65831 Other synovitis and tenosynovitis, right forearm: Secondary | ICD-10-CM | POA: Insufficient documentation

## 2023-03-05 MED ORDER — METHYLPREDNISOLONE ACETATE 40 MG/ML IJ SUSP
40.0000 mg | Freq: Once | INTRAMUSCULAR | Status: AC
  Administered 2023-03-05: 40 mg via INTRA_ARTICULAR

## 2023-03-05 NOTE — Progress Notes (Unsigned)
  Olivia Lee Olivia Lee - 52 y.o. female MRN [id number]  Date of birth: May 31, 1970  PCP: [facility]  Subjective:  No chief complaint on file.  R wrist pain  HPI: Past Medical, Surgical, Social, and Family History Reviewed & Updated per EMR.   Patient is Olivia 52 y.o. female here for pain in the right wrist that has been unresponsive to bracing, ice, and rest. She is accompanied by her husband and both decline interpreter services today. 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 There has been persistent swelling and pain without warmth, fevers, or chills.   History reviewed. No pertinent surgical history.  No Known Allergies      Objective:  Physical Exam: VS: BP:113/74  HR: bpm  TEMP: ( )  RESP:   HT:5' 2.99" (160 cm)   WT:150 lb (68 kg)  BMI:26.58  Gen: Well developed, NAD, speaks clearly, comfortable in exam room Respiratory: Normal work of breathing on room air, no respiratory distress Skin: No rashes, abrasions, or ecchymosis MSK:  Inspection of the RUE shows mild edema over the distal radial forearm 2" proximal to the wrist joint  Full ROM and strength of the elbow R active ROM of the wrist is full  Resisted extension elicits pain and strength 3/5 Grip strength 3/5 on R compared to 4/5 on L No sensory deficits Radial pulses normal    Assessment & Plan:   Extensor intersection syndrome of right wrist - The [place] is consistent w/ intersection syndrome of EPL and ECRB/ECRL - Unfortunately it has not responded to conservative management - Injection as below. We will follow up in 2-4 weeks to reassess. We discussed continuing the brace, icing, and avoiding repetitive motion.  - Work note given for light duty for 1 week  Tendon Sheath Injection Procedure Note Olivia Lee Olivia Lee 08-Mar-1971   Procedure: Intersection Syndrome injection of the EPL and ECR Indications: Pain and  swelling  Procedure Details Verbal consent was obtained. Risks (including rare risk risk of skin atrophy, risk of skin bleaching), benefits, and alternatives were discussed. Prepped with Chloraprep and Ethyl Chloride used for anesthesia. Under sterile conditions, after tendons identified with ultrasound guidance, the patient injected into the edematous space between the EPL and ECR. Aspiration yields no blood. Decreased pain post injection but some persistent soreness. No complications. Needle size: 22 gauge Injection: 1 cc of Lidocaine 1% and 1 cc of methylprednisone  40 mg/mL    Rica Mote MD [facility] Fellow

## 2023-03-05 NOTE — Patient Instructions (Addendum)
Today you received an injection with a steroid. This injection is usually done in response to pain and inflammation. There is some "numbing medicine" (Lidocaine) in the shot, so the injected area may be numb and feel really good for the next couple of hours. The numbing medicine usually wears off in 2-3 hours, and then your pain level may be back to where it was before the injection until the steroid starts working.    You may actually experience a small (as in 10%) INCREASE in pressure sensation in the first 24 hours---that is common.  Things to watch out for that you should contact us or a health care provider urgently would include: 1. Unusual (as in more than 10%) increase in pain 2. New fever > 101.5 3. New swelling or redness of the injected area. 4. Streaking of red lines around the area injected.  Do not hesitate to call or reach out with any questions or concerns.   Continue to use the wrist brace for rest.  You can use ice as well. Avoid strenuous work for the next 24 hours and then start to return slowly to normal activities. Start gentle range of motion exercises.

## 2023-03-05 NOTE — Assessment & Plan Note (Addendum)
-   The [place] is consistent w/ intersection syndrome of EPL and ECRB/ECRL - Unfortunately it has not responded to conservative management - Injection as below. We will follow up in 2-4 weeks to reassess. We discussed continuing the brace, icing, and avoiding repetitive motion.  - Work note given for light duty for 1 week  Tendon Sheath Injection Procedure Note Elle Mahjoub A Dafoe 1970/10/04   Procedure: Intersection Syndrome injection of the EPL and ECR Indications: Pain and swelling  Procedure Details Verbal and written consent was obtained. Risks (including rare risk risk of skin atrophy, risk of skin bleaching), benefits, and alternatives were discussed. Prepped with Chloraprep and Ethyl Chloride used for anesthesia. Under sterile conditions, after tendons identified with ultrasound guidance, the patient injected into the edematous space between the EPL and ECR. Aspiration yields no blood. Decreased pain post injection but some persistent soreness. No complications. Needle size: 25 gauge Injection: 1 cc of Lidocaine 1% and 1 cc of methylprednisone  40 mg/mL

## 2023-03-18 DIAGNOSIS — R1084 Generalized abdominal pain: Secondary | ICD-10-CM | POA: Diagnosis not present

## 2023-03-18 DIAGNOSIS — Z0000 Encounter for general adult medical examination without abnormal findings: Secondary | ICD-10-CM | POA: Diagnosis not present

## 2023-03-18 DIAGNOSIS — Z13220 Encounter for screening for lipoid disorders: Secondary | ICD-10-CM | POA: Diagnosis not present

## 2023-03-18 DIAGNOSIS — Z131 Encounter for screening for diabetes mellitus: Secondary | ICD-10-CM | POA: Diagnosis not present

## 2023-03-18 DIAGNOSIS — R630 Anorexia: Secondary | ICD-10-CM | POA: Diagnosis not present

## 2023-03-18 DIAGNOSIS — Z1211 Encounter for screening for malignant neoplasm of colon: Secondary | ICD-10-CM | POA: Diagnosis not present

## 2023-03-19 ENCOUNTER — Ambulatory Visit: Payer: BC Managed Care – PPO

## 2023-03-19 DIAGNOSIS — Z1211 Encounter for screening for malignant neoplasm of colon: Secondary | ICD-10-CM | POA: Diagnosis not present

## 2023-03-19 NOTE — Progress Notes (Signed)
Patient presents today to pick up custom molded foot orthotics, diagnosed with PF by Dr. Allena Katz.   Orthotics were dispensed and fit was satisfactory. Reviewed instructions for break-in and wear. Written instructions given to patient.  Patient will follow up as needed.   Olivia Lee Cped, CFo, CFm

## 2023-03-26 ENCOUNTER — Ambulatory Visit: Payer: BC Managed Care – PPO | Admitting: Family Medicine

## 2023-04-26 ENCOUNTER — Ambulatory Visit (INDEPENDENT_AMBULATORY_CARE_PROVIDER_SITE_OTHER): Payer: BC Managed Care – PPO | Admitting: Podiatry

## 2023-04-26 ENCOUNTER — Encounter: Payer: Self-pay | Admitting: Podiatry

## 2023-04-26 ENCOUNTER — Ambulatory Visit (INDEPENDENT_AMBULATORY_CARE_PROVIDER_SITE_OTHER): Payer: BC Managed Care – PPO

## 2023-04-26 ENCOUNTER — Telehealth: Payer: Self-pay | Admitting: Podiatry

## 2023-04-26 ENCOUNTER — Telehealth: Payer: Self-pay

## 2023-04-26 VITALS — Ht 62.0 in

## 2023-04-26 DIAGNOSIS — M722 Plantar fascial fibromatosis: Secondary | ICD-10-CM | POA: Diagnosis not present

## 2023-04-26 DIAGNOSIS — M62469 Contracture of muscle, unspecified lower leg: Secondary | ICD-10-CM | POA: Diagnosis not present

## 2023-04-26 DIAGNOSIS — M79671 Pain in right foot: Secondary | ICD-10-CM

## 2023-04-26 DIAGNOSIS — M79672 Pain in left foot: Secondary | ICD-10-CM

## 2023-04-26 MED ORDER — MELOXICAM 15 MG PO TABS
15.0000 mg | ORAL_TABLET | Freq: Every day | ORAL | 0 refills | Status: DC
Start: 2023-04-26 — End: 2023-05-20

## 2023-04-26 MED ORDER — METHYLPREDNISOLONE 4 MG PO TBPK
ORAL_TABLET | ORAL | 0 refills | Status: AC
Start: 2023-04-26 — End: ?

## 2023-04-26 NOTE — Telephone Encounter (Signed)
 Pt called about his wife was to have medication sent to the pharmacy but they did not have it.   I checked and it shows it was sent to the pharmacy at 1032am and confirmed.I verified pharmacy was correct so he is going to call pharmacy.

## 2023-04-26 NOTE — Progress Notes (Signed)
  Subjective:  Patient ID: Olivia Lee, female    DOB: 1970/08/26,  MRN: [id number]  Chief Complaint  Patient presents with   Foot Pain     I have a sharp and numbing pain in both feet x 2 weeks, no injury she has tried Ibuproen, heat, ice and no relief.     53 y.o. female presents with the above complaint.  Patient presents for follow-up of bilateral plantar fasciitis.  She states that the plantar fascia started coming back.  She was doing better however has been off for 2 weeks no injuries no relief  Review of Systems: Negative except as noted in the HPI. Denies N/V/F/Ch.  No past medical history on file.  Current Outpatient Medications:    megestrol (MEGACE) 40 MG tablet, Take 40 mg by mouth daily., Disp: , Rfl:    meloxicam  (MOBIC ) 15 MG tablet, Take 1 tablet (15 mg total) by mouth daily., Disp: 30 tablet, Rfl: 0   methylPREDNISolone  (MEDROL  DOSEPAK) 4 MG TBPK tablet, Take as directed, Disp: 21 each, Rfl: 0   naproxen  (NAPROSYN ) 500 MG tablet, Take 1 tablet (500 mg total) by mouth 2 (two) times daily as needed (pain)., Disp: 30 tablet, Rfl: 0  Social History   Tobacco Use  Smoking Status Never  Smokeless Tobacco Never    No Known Allergies Objective:  There were no vitals filed for this visit. Body mass index is 27.44 kg/m. Constitutional Well developed. Well nourished.  Vascular Dorsalis pedis pulses palpable bilaterally. Posterior tibial pulses palpable bilaterally. Capillary refill normal to all digits.  No cyanosis or clubbing noted. Pedal hair growth normal.  Neurologic Normal speech. Oriented to person, place, and time. Epicritic sensation to light touch grossly present bilaterally.  Dermatologic Nails well groomed and normal in appearance. No open wounds. No skin lesions.  Orthopedic: Normal joint ROM without pain or crepitus bilaterally. No visible deformities. Tender to palpation at the calcaneal tuber bilaterally. No pain with calcaneal  squeeze bilaterally. Ankle ROM diminished range of motion bilaterally. Silfverskiold Test: positive bilaterally.   Radiographs: None  Assessment:   1. Gastrocnemius equinus, unspecified laterality   2. Plantar fasciitis of right foot   3. Plantar fasciitis of left foot     Plan:  Patient was evaluated and treated and all questions answered.  Plantar Fasciitis, bilaterally~recurrence - XR reviewed as above.  - Educated on icing and stretching. Instructions given.  -Injection delivered to the plantar fascia as below. - DME: Plantar fascial brace dispensed to support the medial longitudinal arch of the foot and offload pressure from the heel and prevent arch collapse during weightbearing - Pharmacologic management: None  Pes cavus -I explained to patient the etiology of pes cavus and relationship with Planter fasciitis and various treatment options were discussed.  Given patient foot structure in the setting of Planter fasciitis I believe patient will benefit from custom-made orthotics to help control the hindfoot motion support the arch of the foot and take the stress away from plantar fascial.  Patient agrees with the plan like to proceed with orthotics -Patient has orthotics since he is functioning well with them   Procedure: Injection Tendon/Ligament Location: Bilateral plantar fascia at the glabrous junction; medial approach. Skin Prep: alcohol Injectate: 0.5 cc 0.5% marcaine plain, 0.5 cc of 1% Lidocaine, 0.5 cc kenalog 10. Disposition: Patient tolerated procedure well. Injection site dressed with a band-aid.  No follow-ups on file.

## 2023-04-26 NOTE — Telephone Encounter (Signed)
 Dr.Patel gave me her orthos to adjust the top layer to accommodating layer like the diabetic kind.

## 2023-05-02 DIAGNOSIS — R200 Anesthesia of skin: Secondary | ICD-10-CM | POA: Diagnosis not present

## 2023-05-08 NOTE — Telephone Encounter (Signed)
Sent orthotics back for plastizote top cover to be added

## 2023-05-13 DIAGNOSIS — R200 Anesthesia of skin: Secondary | ICD-10-CM | POA: Diagnosis not present

## 2023-05-13 DIAGNOSIS — R790 Abnormal level of blood mineral: Secondary | ICD-10-CM | POA: Diagnosis not present

## 2023-05-18 ENCOUNTER — Other Ambulatory Visit: Payer: Self-pay | Admitting: Podiatry

## 2023-05-24 ENCOUNTER — Ambulatory Visit (INDEPENDENT_AMBULATORY_CARE_PROVIDER_SITE_OTHER): Payer: Self-pay | Admitting: Podiatry

## 2023-05-24 DIAGNOSIS — M722 Plantar fascial fibromatosis: Secondary | ICD-10-CM

## 2023-05-24 DIAGNOSIS — M62469 Contracture of muscle, unspecified lower leg: Secondary | ICD-10-CM

## 2023-05-24 NOTE — Progress Notes (Signed)
  Subjective:  Patient ID: Olivia Lee, female    DOB: 08-11-1970,  MRN: [id number]  Chief Complaint  Patient presents with   Foot Pain    Pt stated that she is doing a little better     53 y.o. female presents with the above complaint.  Patient presents for follow-up of bilateral plantar fasciitis.  She states that the plantar fascia started coming back.  She was doing better however has been off for 2 weeks no injuries no relief  Review of Systems: Negative except as noted in the HPI. Denies N/V/F/Ch.  No past medical history on file.  Current Outpatient Medications:    megestrol (MEGACE) 40 MG tablet, Take 40 mg by mouth daily., Disp: , Rfl:    meloxicam (MOBIC) 15 MG tablet, TAKE 1 TABLET (15 MG TOTAL) BY MOUTH DAILY., Disp: 30 tablet, Rfl: 0   methylPREDNISolone (MEDROL DOSEPAK) 4 MG TBPK tablet, Take as directed, Disp: 21 each, Rfl: 0   naproxen (NAPROSYN) 500 MG tablet, Take 1 tablet (500 mg total) by mouth 2 (two) times daily as needed (pain)., Disp: 30 tablet, Rfl: 0  Social History   Tobacco Use  Smoking Status Never  Smokeless Tobacco Never    No Known Allergies Objective:  There were no vitals filed for this visit. There is no height or weight on file to calculate BMI. Constitutional Well developed. Well nourished.  Vascular Dorsalis pedis pulses palpable bilaterally. Posterior tibial pulses palpable bilaterally. Capillary refill normal to all digits.  No cyanosis or clubbing noted. Pedal hair growth normal.  Neurologic Normal speech. Oriented to person, place, and time. Epicritic sensation to light touch grossly present bilaterally.  Dermatologic Nails well groomed and normal in appearance. No open wounds. No skin lesions.  Orthopedic: Normal joint ROM without pain or crepitus bilaterally. No visible deformities. Tender to palpation at the calcaneal tuber bilaterally. No pain with calcaneal squeeze bilaterally. Ankle ROM diminished range of  motion bilaterally. Silfverskiold Test: positive bilaterally.   Radiographs: None  Assessment:   1. Gastrocnemius equinus, unspecified laterality   2. Plantar fasciitis of right foot   3. Plantar fasciitis of left foot      Plan:  Patient was evaluated and treated and all questions answered.  Plantar Fasciitis, bilaterally~recurrence - XR reviewed as above.  - Educated on icing and stretching. Instructions given.  -Second injection delivered to the plantar fascia as below. - DME: Plantar fascial brace dispensed to support the medial longitudinal arch of the foot and offload pressure from the heel and prevent arch collapse during weightbearing - Pharmacologic management: None  Pes cavus -I explained to patient the etiology of pes cavus and relationship with Planter fasciitis and various treatment options were discussed.  Given patient foot structure in the setting of Planter fasciitis I believe patient will benefit from custom-made orthotics to help control the hindfoot motion support the arch of the foot and take the stress away from plantar fascial.  Patient agrees with the plan like to proceed with orthotics -Patient has orthotics since he is functioning well with them   Procedure: Injection Tendon/Ligament Location: Bilateral plantar fascia at the glabrous junction; medial approach. Skin Prep: alcohol Injectate: 0.5 cc 0.5% marcaine plain, 0.5 cc of 1% Lidocaine, 0.5 cc kenalog 10. Disposition: Patient tolerated procedure well. Injection site dressed with a band-aid.  No follow-ups on file.

## 2023-05-30 ENCOUNTER — Telehealth: Payer: Self-pay

## 2023-05-30 NOTE — Telephone Encounter (Signed)
 Called pt to let her know she can come pick up her orthotics

## 2023-06-18 ENCOUNTER — Other Ambulatory Visit: Payer: Self-pay | Admitting: Podiatry

## 2023-06-27 DIAGNOSIS — G609 Hereditary and idiopathic neuropathy, unspecified: Secondary | ICD-10-CM | POA: Diagnosis not present

## 2023-07-16 ENCOUNTER — Other Ambulatory Visit: Payer: Self-pay | Admitting: Podiatry

## 2023-08-12 ENCOUNTER — Other Ambulatory Visit: Payer: Self-pay | Admitting: Podiatry

## 2023-09-17 ENCOUNTER — Other Ambulatory Visit: Payer: Self-pay | Admitting: Podiatry

## 2023-10-02 ENCOUNTER — Ambulatory Visit (INDEPENDENT_AMBULATORY_CARE_PROVIDER_SITE_OTHER): Admitting: Podiatry

## 2023-10-02 DIAGNOSIS — M722 Plantar fascial fibromatosis: Secondary | ICD-10-CM | POA: Diagnosis not present

## 2023-10-02 DIAGNOSIS — M62469 Contracture of muscle, unspecified lower leg: Secondary | ICD-10-CM

## 2023-10-02 NOTE — Progress Notes (Signed)
 Ortho work shoes on order [facility] patient will come to try on when in to see if its a proper fit  Lolita Schultze CPed, CFo, CFm  $200 patient pay

## 2023-10-02 NOTE — Progress Notes (Signed)
  Subjective:  Patient ID: Olivia Lee, female    DOB: 02-Jan-1971,  MRN: [id number]  Chief Complaint  Patient presents with   Foot Pain    Pt stated that she is having pain in her heels     53 y.o. female presents with the above complaint.  Patient presents for follow-up of bilateral plantar fasciitis.  She states the pain is still there.  Has not gotten much better.  She cannot do boot or physical therapy.  Still having pain.  Review of Systems: Negative except as noted in the HPI. Denies N/V/F/Ch.  No past medical history on file.  Current Outpatient Medications:    megestrol (MEGACE) 40 MG tablet, Take 40 mg by mouth daily., Disp: , Rfl:    meloxicam  (MOBIC ) 15 MG tablet, TAKE 1 TABLET (15 MG TOTAL) BY MOUTH DAILY., Disp: 30 tablet, Rfl: 0   methylPREDNISolone  (MEDROL  DOSEPAK) 4 MG TBPK tablet, Take as directed, Disp: 21 each, Rfl: 0   naproxen  (NAPROSYN ) 500 MG tablet, Take 1 tablet (500 mg total) by mouth 2 (two) times daily as needed (pain)., Disp: 30 tablet, Rfl: 0  Social History   Tobacco Use  Smoking Status Never  Smokeless Tobacco Never    No Known Allergies Objective:  There were no vitals filed for this visit. There is no height or weight on file to calculate BMI. Constitutional Well developed. Well nourished.  Vascular Dorsalis pedis pulses palpable bilaterally. Posterior tibial pulses palpable bilaterally. Capillary refill normal to all digits.  No cyanosis or clubbing noted. Pedal hair growth normal.  Neurologic Normal speech. Oriented to person, place, and time. Epicritic sensation to light touch grossly present bilaterally.  Dermatologic Nails well groomed and normal in appearance. No open wounds. No skin lesions.  Orthopedic: Normal joint ROM without pain or crepitus bilaterally. No visible deformities. Tender to palpation at the calcaneal tuber bilaterally. No pain with calcaneal squeeze bilaterally. Ankle ROM diminished range of motion  bilaterally. Silfverskiold Test: positive bilaterally.   Radiographs: None  Assessment:   No diagnosis found.    Plan:  Patient was evaluated and treated and all questions answered.  Plantar Fasciitis, bilaterally~recurrence - XR reviewed as above.  - Educated on icing and stretching. Instructions given.  -Second injection delivered to the plantar fascia as below. - DME: Plantar fascial brace dispensed to support the medial longitudinal arch of the foot and offload pressure from the heel and prevent arch collapse during weightbearing - Pharmacologic management: None - Patient is unable to do physical therapy or cam boot due to work.  I discussed surgical options  Pes cavus -I explained to patient the etiology of pes cavus and relationship with Planter fasciitis and various treatment options were discussed.  Given patient foot structure in the setting of Planter fasciitis I believe patient will benefit from custom-made orthotics to help control the hindfoot motion support the arch of the foot and take the stress away from plantar fascial.  Patient agrees with the plan like to proceed with orthotics -Patient has orthotics since he is functioning well with them   Procedure: Injection Tendon/Ligament Location: Bilateral plantar fascia at the glabrous junction; medial approach. Skin Prep: alcohol Injectate: 0.5 cc 0.5% marcaine plain, 0.5 cc of 1% Lidocaine, 0.5 cc kenalog 10. Disposition: Patient tolerated procedure well. Injection site dressed with a band-aid.  No follow-ups on file.

## 2023-10-14 ENCOUNTER — Other Ambulatory Visit: Payer: Self-pay | Admitting: Podiatry

## 2023-10-22 ENCOUNTER — Telehealth: Payer: Self-pay

## 2023-10-22 NOTE — Telephone Encounter (Signed)
 Called interpreter services to schedule ortho shoe fitting.. LVM pt did not answer phone

## 2023-11-06 ENCOUNTER — Ambulatory Visit (INDEPENDENT_AMBULATORY_CARE_PROVIDER_SITE_OTHER): Admitting: Podiatry

## 2023-11-06 ENCOUNTER — Encounter: Payer: Self-pay | Admitting: Podiatry

## 2023-11-06 DIAGNOSIS — M722 Plantar fascial fibromatosis: Secondary | ICD-10-CM

## 2023-11-06 DIAGNOSIS — M62469 Contracture of muscle, unspecified lower leg: Secondary | ICD-10-CM

## 2023-11-06 MED ORDER — GABAPENTIN 300 MG PO CAPS: 300.0000 mg | ORAL_CAPSULE | Freq: Three times a day (TID) | ORAL | 3 refills | Status: AC

## 2023-11-06 NOTE — Progress Notes (Signed)
  Subjective:  Patient ID: Olivia Lee, female    DOB: January 22, 1971,  MRN: [id number]  Chief Complaint  Patient presents with   Foot Pain    Pt stated that she is still having some pain     53 y.o. female presents with the above complaint.  Patient presents for follow-up of bilateral plantar fasciitis.  She states the pain is still there.  Has not gotten much better.  She cannot do boot or physical therapy.  Still having pain.  Review of Systems: Negative except as noted in the HPI. Denies N/V/F/Ch.  No past medical history on file.  Current Outpatient Medications:    megestrol (MEGACE) 40 MG tablet, Take 40 mg by mouth daily., Disp: , Rfl:    meloxicam  (MOBIC ) 15 MG tablet, TAKE 1 TABLET (15 MG TOTAL) BY MOUTH DAILY., Disp: 30 tablet, Rfl: 0   methylPREDNISolone  (MEDROL  DOSEPAK) 4 MG TBPK tablet, Take as directed, Disp: 21 each, Rfl: 0   naproxen  (NAPROSYN ) 500 MG tablet, Take 1 tablet (500 mg total) by mouth 2 (two) times daily as needed (pain)., Disp: 30 tablet, Rfl: 0  Social History   Tobacco Use  Smoking Status Never  Smokeless Tobacco Never    No Known Allergies Objective:  There were no vitals filed for this visit. There is no height or weight on file to calculate BMI. Constitutional Well developed. Well nourished.  Vascular Dorsalis pedis pulses palpable bilaterally. Posterior tibial pulses palpable bilaterally. Capillary refill normal to all digits.  No cyanosis or clubbing noted. Pedal hair growth normal.  Neurologic Normal speech. Oriented to person, place, and time. Epicritic sensation to light touch grossly present bilaterally.  Dermatologic Nails well groomed and normal in appearance. No open wounds. No skin lesions.  Orthopedic: Normal joint ROM without pain or crepitus bilaterally. No visible deformities. Tender to palpation at the calcaneal tuber bilaterally. No pain with calcaneal squeeze bilaterally. Ankle ROM diminished range of motion  bilaterally. Silfverskiold Test: positive bilaterally.   Radiographs: None  Assessment:   No diagnosis found.    Plan:  Patient was evaluated and treated and all questions answered.  Plantar Fasciitis, bilaterally~recurrence - XR reviewed as above.  - Educated on icing and stretching. Instructions given.  - Another njection delivered to the plantar fascia as below. - DME: Plantar fascial brace dispensed to support the medial longitudinal arch of the foot and offload pressure from the heel and prevent arch collapse during weightbearing - Pharmacologic management: None - Patient is unable to do physical therapy or cam boot due to work.  I discussed surgical options  Pes cavus -I explained to patient the etiology of pes cavus and relationship with Planter fasciitis and various treatment options were discussed.  Given patient foot structure in the setting of Planter fasciitis I believe patient will benefit from custom-made orthotics to help control the hindfoot motion support the arch of the foot and take the stress away from plantar fascial.  Patient agrees with the plan like to proceed with orthotics -Patient has orthotics since he is functioning well with them   Procedure: Injection Tendon/Ligament Location: Bilateral plantar fascia at the glabrous junction; medial approach. Skin Prep: alcohol Injectate: 0.5 cc 0.5% marcaine plain, 0.5 cc of 1% Lidocaine, 0.5 cc kenalog 10. Disposition: Patient tolerated procedure well. Injection site dressed with a band-aid.  No follow-ups on file.

## 2023-11-06 NOTE — Progress Notes (Signed)
 Shoes too big will return  [place]

## 2023-11-17 ENCOUNTER — Other Ambulatory Visit: Payer: Self-pay | Admitting: Podiatry
